# Patient Record
Sex: Male | Born: 1985 | Race: Black or African American | Hispanic: No | Marital: Single | Smoking: Never smoker
Health system: Southern US, Community
[De-identification: ages and names within clinical notes are randomized; demographics above are authoritative.]

## PROBLEM LIST (undated history)

## (undated) DIAGNOSIS — Z789 Other specified health status: Secondary | ICD-10-CM

## (undated) HISTORY — DX: Other specified health status: Z78.9

---
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
## 2018-12-11 ENCOUNTER — Emergency Department (HOSPITAL_COMMUNITY): Payer: BLUE CROSS/BLUE SHIELD

## 2018-12-11 ENCOUNTER — Observation Stay (HOSPITAL_COMMUNITY)
Admission: EM | Admit: 2018-12-11 | Discharge: 2018-12-13 | Disposition: A | Discharge status: Home / Self Care | Payer: BLUE CROSS/BLUE SHIELD | Attending: Surgery | Admitting: Surgery

## 2018-12-11 ENCOUNTER — Encounter (HOSPITAL_COMMUNITY): Payer: Self-pay | Admitting: Emergency Medicine

## 2018-12-11 DIAGNOSIS — S2239XA Fracture of one rib, unspecified side, initial encounter for closed fracture: Secondary | ICD-10-CM | POA: Diagnosis present

## 2018-12-11 DIAGNOSIS — S2242XA Multiple fractures of ribs, left side, initial encounter for closed fracture: Secondary | ICD-10-CM | POA: Diagnosis not present

## 2018-12-11 DIAGNOSIS — J982 Interstitial emphysema: Secondary | ICD-10-CM

## 2018-12-11 DIAGNOSIS — X58XXXA Exposure to other specified factors, initial encounter: Secondary | ICD-10-CM | POA: Diagnosis not present

## 2018-12-11 DIAGNOSIS — S62635A Displaced fracture of distal phalanx of left ring finger, initial encounter for closed fracture: Secondary | ICD-10-CM | POA: Diagnosis present

## 2018-12-11 LAB — CBC WITH DIFFERENTIAL/PLATELET
Abs Immature Granulocytes: 0.06 10*3/uL (ref 0.00–0.07)
BASOS PCT: 0 %
Basophils Absolute: 0 10*3/uL (ref 0.0–0.1)
Eosinophils Absolute: 0.1 10*3/uL (ref 0.0–0.5)
Eosinophils Relative: 1 %
HCT: 47.1 % (ref 39.0–52.0)
Hemoglobin: 14.9 g/dL (ref 13.0–17.0)
Immature Granulocytes: 1 %
Lymphocytes Relative: 42 %
Lymphs Abs: 3.2 10*3/uL (ref 0.7–4.0)
MCH: 27.1 pg (ref 26.0–34.0)
MCHC: 31.6 g/dL (ref 30.0–36.0)
MCV: 85.6 fL (ref 80.0–100.0)
Monocytes Absolute: 0.6 10*3/uL (ref 0.1–1.0)
Monocytes Relative: 8 %
Neutro Abs: 3.6 10*3/uL (ref 1.7–7.7)
Neutrophils Relative %: 48 %
PLATELETS: DECREASED 10*3/uL (ref 150–400)
RBC: 5.5 MIL/uL (ref 4.22–5.81)
RDW: 14.1 % (ref 11.5–15.5)
WBC: 7.6 10*3/uL (ref 4.0–10.5)
nRBC: 0 % (ref 0.0–0.2)

## 2018-12-11 LAB — COMPREHENSIVE METABOLIC PANEL
ALBUMIN: 4 g/dL (ref 3.5–5.0)
ALT: 27 U/L (ref 0–44)
AST: 27 U/L (ref 15–41)
Alkaline Phosphatase: 69 U/L (ref 38–126)
Anion gap: 9 (ref 5–15)
BUN: 10 mg/dL (ref 6–20)
CO2: 22 mmol/L (ref 22–32)
Calcium: 8.8 mg/dL — ABNORMAL LOW (ref 8.9–10.3)
Chloride: 105 mmol/L (ref 98–111)
Creatinine, Ser: 1 mg/dL (ref 0.61–1.24)
GFR calc Af Amer: >60 mL/min (ref >60)
GFR calc non Af Amer: >60 mL/min (ref >60)
Glucose, Bld: 108 mg/dL — ABNORMAL HIGH (ref 70–99)
POTASSIUM: 4 mmol/L (ref 3.5–5.1)
Sodium: 136 mmol/L (ref 135–145)
Total Bilirubin: 0.6 mg/dL (ref 0.3–1.2)
Total Protein: 6.7 g/dL (ref 6.5–8.1)

## 2018-12-11 LAB — URINALYSIS, ROUTINE W REFLEX MICROSCOPIC
Bilirubin Urine: NEGATIVE
Glucose, UA: NEGATIVE mg/dL
Hgb urine dipstick: NEGATIVE
Ketones, ur: NEGATIVE mg/dL
Leukocytes,Ua: NEGATIVE
NITRITE: NEGATIVE
Protein, ur: NEGATIVE mg/dL
Specific Gravity, Urine: 1.018 (ref 1.005–1.030)
pH: 5 (ref 5.0–8.0)

## 2018-12-11 MED ORDER — ONDANSETRON HCL 4 MG/2ML IJ SOLN: 4.0000 mg | Freq: Four times a day (QID) | INTRAMUSCULAR | Status: DC | PRN

## 2018-12-11 MED ORDER — ONDANSETRON 4 MG PO TBDP: 4.0000 mg | ORAL_TABLET | Freq: Four times a day (QID) | ORAL | Status: DC | PRN

## 2018-12-11 MED ORDER — HYDROMORPHONE HCL 1 MG/ML IJ SOLN
1.0000 mg | INTRAMUSCULAR | Status: DC | PRN
  Administered 2018-12-11 – 2018-12-12 (×2): 1 mg via INTRAVENOUS
  Filled 2018-12-11 (×2): qty 1

## 2018-12-11 MED ORDER — TRAMADOL HCL 50 MG PO TABS
50.0000 mg | ORAL_TABLET | Freq: Four times a day (QID) | ORAL | Status: DC | PRN
  Administered 2018-12-13: 50 mg via ORAL
  Filled 2018-12-11: qty 1

## 2018-12-11 MED ORDER — DEXTROSE-NACL 5-0.9 % IV SOLN
INTRAVENOUS | Status: DC
  Administered 2018-12-11 – 2018-12-13 (×2): via INTRAVENOUS

## 2018-12-11 MED ORDER — MORPHINE SULFATE (PF) 4 MG/ML IV SOLN: 4.0000 mg | Freq: Once | INTRAVENOUS | Status: DC

## 2018-12-11 MED ORDER — SODIUM CHLORIDE 0.9 % IV BOLUS
1000.0000 mL | Freq: Once | INTRAVENOUS | Status: AC
  Administered 2018-12-11: 1000 mL via INTRAVENOUS

## 2018-12-11 MED ORDER — MORPHINE SULFATE (PF) 4 MG/ML IV SOLN
4.0000 mg | Freq: Once | INTRAVENOUS | Status: AC
  Administered 2018-12-11: 4 mg via INTRAVENOUS
  Filled 2018-12-11: qty 1

## 2018-12-11 MED ORDER — OXYCODONE HCL 5 MG PO TABS
10.0000 mg | ORAL_TABLET | ORAL | Status: DC | PRN
  Administered 2018-12-12 – 2018-12-13 (×4): 10 mg via ORAL
  Filled 2018-12-11 (×4): qty 2

## 2018-12-11 MED ORDER — TETANUS-DIPHTH-ACELL PERTUSSIS 5-2.5-18.5 LF-MCG/0.5 IM SUSP
0.5000 mL | Freq: Once | INTRAMUSCULAR | Status: AC
  Administered 2018-12-11: 0.5 mL via INTRAMUSCULAR
  Filled 2018-12-11: qty 0.5

## 2018-12-11 MED ORDER — IOHEXOL 300 MG/ML  SOLN
100.0000 mL | Freq: Once | INTRAMUSCULAR | Status: AC | PRN
  Administered 2018-12-11: 100 mL via INTRAVENOUS

## 2018-12-11 MED ORDER — ENOXAPARIN SODIUM 40 MG/0.4ML ~~LOC~~ SOLN
40.0000 mg | Freq: Every day | SUBCUTANEOUS | Status: DC
  Administered 2018-12-12 – 2018-12-13 (×2): 40 mg via SUBCUTANEOUS
  Filled 2018-12-11 (×2): qty 0.4

## 2018-12-11 NOTE — ED Notes (Signed)
Patient transported to x-ray. ?
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
## 2018-12-12 ENCOUNTER — Other Ambulatory Visit: Payer: Self-pay

## 2018-12-12 ENCOUNTER — Observation Stay (HOSPITAL_COMMUNITY): Payer: BLUE CROSS/BLUE SHIELD

## 2018-12-12 LAB — HIV ANTIBODY (ROUTINE TESTING W REFLEX): HIV Screen 4th Generation wRfx: NONREACTIVE

## 2018-12-12 MED ORDER — HYDRALAZINE HCL 20 MG/ML IJ SOLN: 20.0000 mg | INTRAMUSCULAR | Status: DC | PRN

## 2018-12-12 MED ORDER — ACETAMINOPHEN 500 MG PO TABS
1000.0000 mg | ORAL_TABLET | Freq: Four times a day (QID) | ORAL | Status: DC
  Administered 2018-12-13 (×3): 1000 mg via ORAL
  Filled 2018-12-12 (×3): qty 2

## 2018-12-12 MED ORDER — METHOCARBAMOL 500 MG PO TABS
500.0000 mg | ORAL_TABLET | Freq: Three times a day (TID) | ORAL | Status: DC
  Administered 2018-12-13 (×2): 500 mg via ORAL
  Filled 2018-12-12 (×2): qty 1
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
## 2018-12-12 NOTE — Plan of Care (Signed)
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
## 2018-12-12 NOTE — Progress Notes (Signed)
2000- he wanted to get up to use bathroom- and walk hallway- I assisted him to bathroom. I proceeded to go to room 29 to assess that pt. I heard a commotion next door (28)- rushed next door he was standing at doorway stating he couldn't use his arm referring to his right arm. I assisted him back to bed- did guick neuro assessment- noted decrease motor control, and decreased sensation in that extremity. Informed him we would check his vital signs and call the Dr. Truitt Merle on call phys. Dr. Dwain Sarna- obtained order for stat head ct. Juliette Alcide RN was unit as Engineer, production. Also provided assistance.

## 2018-12-12 NOTE — Progress Notes (Signed)
Patient ID: Daniel Schmidt, male   DOB: 06/10/1986, 33 y.o.   MRN: [id number] ctsp for loss of feeling in right hand.  His head ct is negative. On exam he is hard to get to describe what is going on. His strength is intact for most part, he states sensation not present or "different".  Pulses intact. The remainder of his neuro exam is normal I dont think he has stroke but certainly could have some neuropathy. Will send for mr of c spine and brachial plexus

## 2018-12-12 NOTE — Progress Notes (Signed)
Pt to radiology and back was again assessed by rapid response team nurse, while there. 2138: was assessed by Dr. Dwain Sarna on the unit- see note. Has been resting since return.

## 2018-12-13 ENCOUNTER — Observation Stay (HOSPITAL_COMMUNITY): Payer: BLUE CROSS/BLUE SHIELD

## 2018-12-13 MED ORDER — OXYCODONE HCL 5 MG PO TABS: 5.0000 mg | ORAL_TABLET | Freq: Four times a day (QID) | ORAL | 0 refills | Status: DC | PRN

## 2018-12-13 NOTE — Plan of Care (Signed)
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
## 2018-12-13 NOTE — Progress Notes (Signed)
Pt could not do MRI at this time, attempted to but after just a couple minutes stopped exam due to claustrophobia.  Asked patient if he wanted [place] to see about getting some medication right now to help and he said he did not want to at this time, he did not want to go back in scanner.  RN informed, possibly attempt later with medication

## 2018-12-13 NOTE — Discharge Instructions (Signed)
Ok to take tylenol and ibuprofen for pain  No vigorous activity for one to two weeks  But up and walking as much as possible Rib Fracture  A rib fracture is a break or crack in one of the bones of the ribs. The ribs are like a cage that goes around your upper chest. A broken or cracked rib is often painful, but most do not cause other problems. Most rib fractures usually heal on their own in 1-3 months. Follow these instructions at home: Managing pain, stiffness, and swelling  If directed, apply ice to the injured area. ? Put ice in a plastic bag. ? Place a towel between your skin and the bag. ? Leave the ice on for 20 minutes, 2-3 times a day.  Take over-the-counter and prescription medicines only as told by your doctor. Activity  Avoid activities that cause pain to the injured area. Protect your injured area.  Slowly increase activity as told by your doctor. General instructions  Do deep breathing as told by your doctor. You may be told to: ? Take deep breaths many times a day. ? Cough many times a day while hugging a pillow. ? Use a device (incentive spirometer) to do deep breathing many times a day.  Drink enough fluid to keep your pee (urine) clear or pale yellow.  Do not wear a rib belt or binder. These do not allow you to breathe deeply.  Keep all follow-up visits as told by your doctor. This is important. Contact a doctor if:  You have a fever. Get help right away if:  You have trouble breathing.  You are short of breath.  You cannot stop coughing.  You cough up thick or bloody spit (sputum).  You feel sick to your stomach (nauseous), throw up (vomit), or have belly (abdominal) pain.  Your pain gets worse and medicine does not help. Summary  A rib fracture is a break or crack in one of the bones of the ribs.  Apply ice to the injured area and take medicines for pain as told by your doctor.  Take deep breaths and cough many times a day. Hug a pillow  every time you cough. This information is not intended to replace advice given to you by your health care provider. Make sure you discuss any questions you have with your health care provider. Document Released: 06/11/2008 Document Revised: 12/03/2016 Document Reviewed: 12/03/2016 Elsevier Interactive Patient Education  2019 [employer].

## 2018-12-13 NOTE — Progress Notes (Signed)
PIV removed. No further questions regarding d/c paperwork. D/c via wheelchair.

## 2018-12-14 ENCOUNTER — Emergency Department (HOSPITAL_COMMUNITY): Payer: BLUE CROSS/BLUE SHIELD

## 2018-12-14 ENCOUNTER — Other Ambulatory Visit: Payer: Self-pay

## 2018-12-14 ENCOUNTER — Emergency Department (HOSPITAL_COMMUNITY)
Admission: EM | Admit: 2018-12-14 | Discharge: 2018-12-14 | Disposition: A | Discharge status: Home / Self Care | Payer: BLUE CROSS/BLUE SHIELD | Attending: Emergency Medicine | Admitting: Emergency Medicine

## 2018-12-14 ENCOUNTER — Encounter (HOSPITAL_COMMUNITY): Payer: Self-pay | Admitting: Emergency Medicine

## 2018-12-14 DIAGNOSIS — S060X0A Concussion without loss of consciousness, initial encounter: Secondary | ICD-10-CM

## 2018-12-14 DIAGNOSIS — S062X0D Diffuse traumatic brain injury without loss of consciousness, subsequent encounter: Secondary | ICD-10-CM | POA: Diagnosis not present

## 2018-12-14 DIAGNOSIS — S060X0D Concussion without loss of consciousness, subsequent encounter: Secondary | ICD-10-CM | POA: Insufficient documentation

## 2018-12-14 DIAGNOSIS — R0789 Other chest pain: Secondary | ICD-10-CM | POA: Insufficient documentation

## 2018-12-14 DIAGNOSIS — R4182 Altered mental status, unspecified: Secondary | ICD-10-CM | POA: Diagnosis present

## 2018-12-14 LAB — CBC WITH DIFFERENTIAL/PLATELET
ABS IMMATURE GRANULOCYTES: 0.01 10*3/uL (ref 0.00–0.07)
Basophils Absolute: 0 10*3/uL (ref 0.0–0.1)
Basophils Relative: 0 %
Eosinophils Absolute: 0.1 10*3/uL (ref 0.0–0.5)
Eosinophils Relative: 1 %
HCT: 47.5 % (ref 39.0–52.0)
Hemoglobin: 15.2 g/dL (ref 13.0–17.0)
Immature Granulocytes: 0 %
Lymphocytes Relative: 36 %
Lymphs Abs: 3 10*3/uL (ref 0.7–4.0)
MCH: 26.7 pg (ref 26.0–34.0)
MCHC: 32 g/dL (ref 30.0–36.0)
MCV: 83.5 fL (ref 80.0–100.0)
Monocytes Absolute: 0.9 10*3/uL (ref 0.1–1.0)
Monocytes Relative: 11 %
Neutro Abs: 4.3 10*3/uL (ref 1.7–7.7)
Neutrophils Relative %: 52 %
Platelets: 226 10*3/uL (ref 150–400)
RBC: 5.69 MIL/uL (ref 4.22–5.81)
RDW: 13.2 % (ref 11.5–15.5)
WBC: 8.2 10*3/uL (ref 4.0–10.5)
nRBC: 0 % (ref 0.0–0.2)

## 2018-12-14 LAB — COMPREHENSIVE METABOLIC PANEL
ALT: 27 U/L (ref 0–44)
AST: 24 U/L (ref 15–41)
Albumin: 3.6 g/dL (ref 3.5–5.0)
Alkaline Phosphatase: 64 U/L (ref 38–126)
Anion gap: 6 (ref 5–15)
BUN: 8 mg/dL (ref 6–20)
CHLORIDE: 104 mmol/L (ref 98–111)
CO2: 26 mmol/L (ref 22–32)
Calcium: 8.9 mg/dL (ref 8.9–10.3)
Creatinine, Ser: 0.99 mg/dL (ref 0.61–1.24)
GFR calc Af Amer: >60 mL/min (ref >60)
GFR calc non Af Amer: >60 mL/min (ref >60)
Glucose, Bld: 102 mg/dL — ABNORMAL HIGH (ref 70–99)
Potassium: 3.7 mmol/L (ref 3.5–5.1)
SODIUM: 136 mmol/L (ref 135–145)
Total Bilirubin: 0.9 mg/dL (ref 0.3–1.2)
Total Protein: 7.2 g/dL (ref 6.5–8.1)

## 2018-12-14 LAB — PROTIME-INR
INR: 1 (ref 0.8–1.2)
Prothrombin Time: 12.8 seconds (ref 11.4–15.2)

## 2018-12-14 LAB — CBG MONITORING, ED: Glucose-Capillary: 110 mg/dL — ABNORMAL HIGH (ref 70–99)

## 2018-12-14 MED ORDER — GADOBUTROL 1 MMOL/ML IV SOLN
9.0000 mL | Freq: Once | INTRAVENOUS | Status: AC | PRN
  Administered 2018-12-14: 9 mL via INTRAVENOUS

## 2018-12-14 MED ORDER — LORAZEPAM 2 MG/ML IJ SOLN
2.0000 mg | Freq: Once | INTRAMUSCULAR | Status: AC
  Administered 2018-12-14: 2 mg via INTRAVENOUS
  Filled 2018-12-14: qty 1

## 2018-12-14 MED ORDER — ACETAMINOPHEN 500 MG PO TABS: 1000.0000 mg | ORAL_TABLET | Freq: Three times a day (TID) | ORAL | 0 refills | Status: DC | PRN

## 2018-12-14 MED ORDER — SODIUM CHLORIDE 0.9 % IV BOLUS
1000.0000 mL | Freq: Once | INTRAVENOUS | Status: AC
  Administered 2018-12-14: 1000 mL via INTRAVENOUS

## 2018-12-14 MED ORDER — LEVETIRACETAM 500 MG PO TABS: 500.0000 mg | ORAL_TABLET | Freq: Two times a day (BID) | ORAL | 0 refills | Status: DC

## 2018-12-14 MED ORDER — LEVETIRACETAM 500 MG PO TABS
500.0000 mg | ORAL_TABLET | Freq: Once | ORAL | Status: AC
  Administered 2018-12-14: 500 mg via ORAL
  Filled 2018-12-14: qty 1

## 2018-12-14 MED ORDER — PROCHLORPERAZINE MALEATE 10 MG PO TABS: 10.0000 mg | ORAL_TABLET | Freq: Three times a day (TID) | ORAL | 0 refills | Status: DC | PRN

## 2018-12-14 NOTE — ED Notes (Signed)
Patient verbalizes understanding of discharge instructions. Opportunity for questioning and answers were provided. Armband removed by staff, pt discharged from ED ambulatory.   
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
## 2018-12-14 NOTE — ED Notes (Signed)
Stanford Splitt (mother) [phone] call for updates

## 2018-12-14 NOTE — ED Notes (Signed)
Patient transported to MRI 

## 2018-12-14 NOTE — ED Notes (Signed)
ED Provider at bedside. 

## 2018-12-22 ENCOUNTER — Other Ambulatory Visit: Payer: Self-pay | Admitting: Student

## 2018-12-22 DIAGNOSIS — S06339A Contusion and laceration of cerebrum, unspecified, with loss of consciousness of unspecified duration, initial encounter: Secondary | ICD-10-CM

## 2018-12-22 DIAGNOSIS — S0633AA Contusion and laceration of cerebrum, unspecified, with loss of consciousness status unknown, initial encounter: Secondary | ICD-10-CM

## 2018-12-24 ENCOUNTER — Emergency Department (HOSPITAL_COMMUNITY): Payer: BLUE CROSS/BLUE SHIELD

## 2018-12-24 ENCOUNTER — Ambulatory Visit
Admission: RE | Admit: 2018-12-24 | Discharge: 2018-12-24 | Disposition: A | Discharge status: Home / Self Care | Payer: BLUE CROSS/BLUE SHIELD | Source: Ambulatory Visit | Attending: Student | Admitting: Student

## 2018-12-24 ENCOUNTER — Other Ambulatory Visit (HOSPITAL_COMMUNITY): Payer: Self-pay | Admitting: Student

## 2018-12-24 ENCOUNTER — Emergency Department (HOSPITAL_COMMUNITY)
Admission: EM | Admit: 2018-12-24 | Discharge: 2018-12-24 | Disposition: A | Discharge status: Home / Self Care | Payer: BLUE CROSS/BLUE SHIELD | Attending: Emergency Medicine | Admitting: Emergency Medicine

## 2018-12-24 ENCOUNTER — Other Ambulatory Visit: Payer: Self-pay

## 2018-12-24 ENCOUNTER — Other Ambulatory Visit: Payer: Self-pay | Admitting: Student

## 2018-12-24 DIAGNOSIS — Z09 Encounter for follow-up examination after completed treatment for conditions other than malignant neoplasm: Secondary | ICD-10-CM | POA: Diagnosis present

## 2018-12-24 DIAGNOSIS — S0633AA Contusion and laceration of cerebrum, unspecified, with loss of consciousness status unknown, initial encounter: Secondary | ICD-10-CM

## 2018-12-24 DIAGNOSIS — I6529 Occlusion and stenosis of unspecified carotid artery: Secondary | ICD-10-CM

## 2018-12-24 DIAGNOSIS — Z8673 Personal history of transient ischemic attack (TIA), and cerebral infarction without residual deficits: Secondary | ICD-10-CM | POA: Insufficient documentation

## 2018-12-24 DIAGNOSIS — S06339D Contusion and laceration of cerebrum, unspecified, with loss of consciousness of unspecified duration, subsequent encounter: Secondary | ICD-10-CM | POA: Diagnosis not present

## 2018-12-24 DIAGNOSIS — Y33XXXD Other specified events, undetermined intent, subsequent encounter: Secondary | ICD-10-CM | POA: Diagnosis not present

## 2018-12-24 DIAGNOSIS — S06339A Contusion and laceration of cerebrum, unspecified, with loss of consciousness of unspecified duration, initial encounter: Secondary | ICD-10-CM

## 2018-12-24 DIAGNOSIS — S020XXA Fracture of vault of skull, initial encounter for closed fracture: Secondary | ICD-10-CM

## 2018-12-24 LAB — CBC WITH DIFFERENTIAL/PLATELET
Abs Immature Granulocytes: 0.05 10*3/uL (ref 0.00–0.07)
Basophils Absolute: 0 10*3/uL (ref 0.0–0.1)
Basophils Relative: 1 %
Eosinophils Absolute: 0.1 10*3/uL (ref 0.0–0.5)
Eosinophils Relative: 1 %
HCT: 45.3 % (ref 39.0–52.0)
Hemoglobin: 14.8 g/dL (ref 13.0–17.0)
Immature Granulocytes: 1 %
Lymphocytes Relative: 33 %
Lymphs Abs: 2.8 10*3/uL (ref 0.7–4.0)
MCH: 27.6 pg (ref 26.0–34.0)
MCHC: 32.7 g/dL (ref 30.0–36.0)
MCV: 84.4 fL (ref 80.0–100.0)
Monocytes Absolute: 0.8 10*3/uL (ref 0.1–1.0)
Monocytes Relative: 10 %
Neutro Abs: 4.8 10*3/uL (ref 1.7–7.7)
Neutrophils Relative %: 54 %
Platelets: 361 10*3/uL (ref 150–400)
RBC: 5.37 MIL/uL (ref 4.22–5.81)
RDW: 13.7 % (ref 11.5–15.5)
WBC: 8.5 10*3/uL (ref 4.0–10.5)
nRBC: 0 % (ref 0.0–0.2)

## 2018-12-24 LAB — COMPREHENSIVE METABOLIC PANEL
ALT: 34 U/L (ref 0–44)
AST: 25 U/L (ref 15–41)
Albumin: 3.9 g/dL (ref 3.5–5.0)
Alkaline Phosphatase: 131 U/L — ABNORMAL HIGH (ref 38–126)
Anion gap: 10 (ref 5–15)
BUN: 12 mg/dL (ref 6–20)
CO2: 22 mmol/L (ref 22–32)
Calcium: 9.3 mg/dL (ref 8.9–10.3)
Chloride: 106 mmol/L (ref 98–111)
Creatinine, Ser: 1.09 mg/dL (ref 0.61–1.24)
GFR calc Af Amer: >60 mL/min (ref >60)
GFR calc non Af Amer: >60 mL/min (ref >60)
Glucose, Bld: 109 mg/dL — ABNORMAL HIGH (ref 70–99)
Potassium: 4.1 mmol/L (ref 3.5–5.1)
Sodium: 138 mmol/L (ref 135–145)
Total Bilirubin: 0.7 mg/dL (ref 0.3–1.2)
Total Protein: 7.4 g/dL (ref 6.5–8.1)

## 2018-12-24 LAB — I-STAT CREATININE, ED: Creatinine, Ser: 0.9 mg/dL (ref 0.61–1.24)

## 2018-12-24 LAB — PROTIME-INR
INR: 1 (ref 0.8–1.2)
Prothrombin Time: 13.2 seconds (ref 11.4–15.2)

## 2018-12-24 MED ORDER — IOHEXOL 350 MG/ML SOLN
75.0000 mL | Freq: Once | INTRAVENOUS | Status: AC | PRN
  Administered 2018-12-24: 75 mL via INTRAVENOUS

## 2018-12-24 NOTE — ED Notes (Signed)
ED Provider at bedside. 

## 2018-12-24 NOTE — ED Notes (Signed)
Patient transported to CT 
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
## 2018-12-24 NOTE — ED Notes (Signed)
Pt returned from CT °

## 2018-12-25 ENCOUNTER — Ambulatory Visit (HOSPITAL_COMMUNITY): Admission: RE | Admit: 2018-12-25 | Payer: BLUE CROSS/BLUE SHIELD | Source: Ambulatory Visit

## 2018-12-25 ENCOUNTER — Encounter (HOSPITAL_COMMUNITY): Payer: Self-pay

## 2019-01-12 ENCOUNTER — Other Ambulatory Visit: Payer: Self-pay

## 2019-01-12 ENCOUNTER — Telehealth (INDEPENDENT_AMBULATORY_CARE_PROVIDER_SITE_OTHER): Payer: BLUE CROSS/BLUE SHIELD | Admitting: Neurology

## 2019-01-12 VITALS — BP 130/79

## 2019-01-12 DIAGNOSIS — S06339A Contusion and laceration of cerebrum, unspecified, with loss of consciousness of unspecified duration, initial encounter: Secondary | ICD-10-CM

## 2019-01-12 DIAGNOSIS — S0633AA Contusion and laceration of cerebrum, unspecified, with loss of consciousness status unknown, initial encounter: Secondary | ICD-10-CM

## 2019-01-12 DIAGNOSIS — R9089 Other abnormal findings on diagnostic imaging of central nervous system: Secondary | ICD-10-CM | POA: Diagnosis not present
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
## 2019-01-13 ENCOUNTER — Encounter: Payer: Self-pay | Admitting: Neurology

## 2019-01-13 ENCOUNTER — Other Ambulatory Visit: Payer: Self-pay

## 2019-01-13 DIAGNOSIS — S0633AA Contusion and laceration of cerebrum, unspecified, with loss of consciousness status unknown, initial encounter: Secondary | ICD-10-CM

## 2019-01-13 DIAGNOSIS — R9089 Other abnormal findings on diagnostic imaging of central nervous system: Secondary | ICD-10-CM

## 2019-01-13 DIAGNOSIS — S06339A Contusion and laceration of cerebrum, unspecified, with loss of consciousness of unspecified duration, initial encounter: Secondary | ICD-10-CM

## 2019-01-13 NOTE — Progress Notes (Signed)
Orders placed for echocardiogram and MRI

## 2019-02-19 ENCOUNTER — Other Ambulatory Visit: Payer: Self-pay

## 2019-02-19 ENCOUNTER — Ambulatory Visit (HOSPITAL_COMMUNITY)
Admission: RE | Admit: 2019-02-19 | Discharge: 2019-02-19 | Disposition: A | Discharge status: Home / Self Care | Payer: BLUE CROSS/BLUE SHIELD | Source: Ambulatory Visit | Attending: Neurology | Admitting: Neurology

## 2019-02-19 DIAGNOSIS — R42 Dizziness and giddiness: Secondary | ICD-10-CM | POA: Diagnosis present

## 2019-02-19 DIAGNOSIS — S0633AA Contusion and laceration of cerebrum, unspecified, with loss of consciousness status unknown, initial encounter: Secondary | ICD-10-CM

## 2019-02-19 DIAGNOSIS — S06339A Contusion and laceration of cerebrum, unspecified, with loss of consciousness of unspecified duration, initial encounter: Secondary | ICD-10-CM

## 2019-02-19 DIAGNOSIS — R9089 Other abnormal findings on diagnostic imaging of central nervous system: Secondary | ICD-10-CM | POA: Diagnosis not present

## 2019-02-22 ENCOUNTER — Other Ambulatory Visit: Payer: Self-pay

## 2019-02-22 ENCOUNTER — Ambulatory Visit
Admission: RE | Admit: 2019-02-22 | Discharge: 2019-02-22 | Disposition: A | Discharge status: Home / Self Care | Payer: BLUE CROSS/BLUE SHIELD | Source: Ambulatory Visit | Attending: Neurology | Admitting: Neurology

## 2019-02-22 DIAGNOSIS — S0633AA Contusion and laceration of cerebrum, unspecified, with loss of consciousness status unknown, initial encounter: Secondary | ICD-10-CM

## 2019-02-22 DIAGNOSIS — S06339A Contusion and laceration of cerebrum, unspecified, with loss of consciousness of unspecified duration, initial encounter: Secondary | ICD-10-CM

## 2019-02-22 DIAGNOSIS — R9089 Other abnormal findings on diagnostic imaging of central nervous system: Secondary | ICD-10-CM

## 2019-02-22 MED ORDER — GADOBENATE DIMEGLUMINE 529 MG/ML IV SOLN
20.0000 mL | Freq: Once | INTRAVENOUS | Status: AC | PRN
  Administered 2019-02-22: 16:00:00 20 mL via INTRAVENOUS

## 2019-02-23 ENCOUNTER — Telehealth: Payer: Self-pay
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
## 2019-08-23 ENCOUNTER — Encounter: Payer: Self-pay | Admitting: Neurology

## 2019-08-30 ENCOUNTER — Other Ambulatory Visit: Payer: Self-pay

## 2019-08-30 ENCOUNTER — Encounter: Payer: Self-pay | Admitting: Neurology

## 2019-08-30 ENCOUNTER — Telehealth (INDEPENDENT_AMBULATORY_CARE_PROVIDER_SITE_OTHER): Payer: Self-pay | Admitting: Neurology

## 2019-08-30 VITALS — Ht 70.0 in | Wt 260.0 lb

## 2019-08-30 DIAGNOSIS — S06339A Contusion and laceration of cerebrum, unspecified, with loss of consciousness of unspecified duration, initial encounter: Secondary | ICD-10-CM

## 2019-08-30 DIAGNOSIS — S0633AA Contusion and laceration of cerebrum, unspecified, with loss of consciousness status unknown, initial encounter: Secondary | ICD-10-CM
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
